# Patient Record
Sex: Male | Born: 1990 | Race: White | Hispanic: No | Marital: Single | Smoking: Current every day smoker
Health system: Southern US, Community
[De-identification: ages and names within clinical notes are randomized; demographics above are authoritative.]

---
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
## 2010-08-28 NOTE — Op Note (Signed)
NAME:  Spencer Barr, SKODA NO.:  [id number]   MEDICAL RECORD NO.:  [id number]          PATIENT TYPE:  INP   LOCATION:  2550                         FACILITY:  [facility]   PHYSICIAN:  Kerrin Champagne, M.D.   DATE OF BIRTH:  11-06-1990   DATE OF PROCEDURE:  09/21/2005  DATE OF DISCHARGE:                                 OPERATIVE REPORT   PREOPERATIVE DIAGNOSIS:  Left open grade 2 tibia-fibula fracture, occurred  June 03, 2005.  He underwent initial irrigation and debridement,  returned for a second irrigation and debridement, with closure over a drain  and application of external fixator.  Fracture site is comminute, with  butterfly fragment of the tibia.  The fibula has gone on to heal.  He  returned to the operating room for application of a medial external fixator  frame with transverse loading rods between both medial and lateral frames.  He has since gone on to form callus at the fracture site, and it is time to  remove his external fracture.   POSTOPERATIVE DIAGNOSIS:  Left open grade 2 tibia-fibula fracture, occurred  June 03, 2005.  He underwent initial irrigation and debridement,  returned for a second irrigation and debridement, with closure over a drain  and application of external fixator.  Fracture site is comminute, with  butterfly fragment of the tibia.  The fibula has gone on to heal.  He  returned to the operating room for application of a medial external fixator  frame with transverse loading rods between both medial and lateral frames.  He has since gone on to form callus at the fracture site, and it is time to  remove his external fracture.   PROCEDURES:  1.  Removal of external fixator left tibia.  2.  Evaluation of fracture under anesthesia.  3.  Incision, drainage, and debridement of six Schanz screw pin tracts left      tibia, four lateral and two medial.  4.  Application of a short-leg fiberglass cast.   SURGEON:  Kerrin Champagne,  M.D.   ASSISTANT:  Wende Neighbors, P.A-.C.   ANESTHESIA:  General via orotracheal.  Operator:  Judie Petit, M.D.   SPECIMENS:  Cultures of the lower two pin tracts on the lateral side of the  tibia were sent for culture and sensitivity in both anaerobic and aerobic.   ESTIMATED BLOOD LOSS:  20 cc.   COMPLICATIONS:  None.   The patient returned to the PACU in good condition.   HISTORY OF PRESENT ILLNESS:  The patient is a 20 year old male who sustained  a left open grade 2 tibia-fibula fracture when he jumped from a shed to a  trampoline.  Injury occurred June 03, 2005.  He was taken to the  operating room and underwent irrigation, debridement, application of  posterior long-leg splint.  He was returned to the operating room three days  later and underwent repeat irrigation and debridement, with application of  external fixator and closure of the open wound over the medial aspect of the  left distal tibia.  This was done over drains.  He went on to heal the open  wound, all except an area of eschar.  With the fixator in place at about 4-6  weeks postop, noted to be going into angular deformity of varus.  He was  returned to the operating room some 6-1/2 weeks ago and underwent an  irrigation and debridement of the eschar that was found to extend down to  the tibia fracture site.  This was closed with a single suture by Dr.  Myrene Galas.  He had application of a medial tibial frame and manipulation  of the fracture into a valgus and neutral position.  Cross-links were  performed between the medial and lateral frames for a triangular type of  stabilization of the fracture site.  The patient was then allowed to  weightbear and has been weightbearing over the last 4 weeks, 25%, then 50%  weightbearing on his left lower extremity.  Radiographs at last take  demonstrated callus forming at the fracture site both posteriorly and  medially, bridging the butterfly fragment as  well as the distal and proximal  fracture fragments.  With this completed, then the patient was scheduled  then to have removal of this external fixator.  He was also noted to have  erythema and drainage from the distal and lateral pin tracts greater than  the medial pin tracts or proximal pin tracts.   INTRAOPERATIVE FINDINGS:  The patient was found to have some very minimal  remaining lucency over the medial portion of the tibial fracture site, good  callus both proximal and distal, connecting the butterfly fragment to both  the distal fragment and the proximal fracture fragment.  Enough stability  felt to be present that the external fixator could be removed.  Careful  observation, though, will be necessary to ensure that he does not begin to  go into a varus deformity following the removal of the external fixator.   This completed, then the patient had removal of the pins following the  removal of the external fixator frame, and then underwent I&D of the pin  tracts and application of a short-leg cast.   DESCRIPTION OF PROCEDURE:  After adequate general anesthesia, left lower  extremity had removal of the external fixator frame, and then C-arm  fluoroscopy was brought into the field.  Under C-arm fluoroscopy, the  fracture was stressed.  It showed minimal if any movement at the fracture  site medially with stressing of varus and valgus.  With this, it was felt  that the pins could be removed.  Each of the six Schanz screws were then  removed without difficulty.  The patient then underwent prep of his left  lower extremity from the toes to the lower thigh with both Betadine scrub  and prep solution, and was draped in the usual manner.  Each of the pin  tracks then were carefully curettaged and debrided using curettes, a  separate curette for each of the pin tract sites, to prevent cross-  contamination.  While performing the I&D of the lower two pin tracts on the lateral side of the  leg, cultures were sent, both anaerobic and aerobic for  culture and sensitivity purposes.  Following the debridement of each of the  pin tract sites, then, these then were followed for hemostasis.  They showed  excellent hemostasis, and Xeroform was then placed over each of the pin  tract areas, as well as over a small eschar that had previously been I&Ded.  It showed no erythema,  drainage, or fluctuans.  A well-padded short-leg cast  was then applied, with 4 x 4s and ABDs over the anterior aspect of the leg,  over both the pins medially and laterally.  Sterile Webril was applied, and  then regular Webril and a synthetic short-leg cast applied, with the leg in  slight valgus to neutral alignment.  The patient was then reactivated,  extubated, and returned to the recovery room in satisfactory condition.  All  instrument and sponge counts were correct.  Pins will be given to the  patient to take home.      Kerrin Champagne, M.D.  Electronically Signed     JEN/MEDQ  D:  09/21/2005  T:  09/21/2005  Job:  [id number]

## 2010-08-28 NOTE — Discharge Summary (Signed)
NAME:  Spencer Barr, Spencer Barr NO.:  [id number]   MEDICAL RECORD NO.:  [id number]          PATIENT TYPE:  INP   LOCATION:  6125                         FACILITY:  [facility]   PHYSICIAN:  Kerrin Champagne, M.D.   DATE OF BIRTH:  08/27/1990   DATE OF ADMISSION:  06/02/2005  DATE OF DISCHARGE:  06/07/2005                                 DISCHARGE SUMMARY   DISCHARGE DIAGNOSIS:  An open grade II left tibia-fibula fracture transverse  with medial butterfly fragment involving the mid distal one-third shaft.   PROCEDURES PERFORMED DURING THIS HOSPITALIZATION:  1. Incision, irrigation and debridement of left open grade II tibia-fibula      fracture involving the distal middle one-third of the tibia shaft.      Application of a posterior splint performed on June 01, 2005.  2. Incision, irrigation and debridement of left open grade II tibia-fibula      fracture with application of a Synthes external fixator unilateral      frame over the lateral aspect of the left distal tibia/fibula performed      on June 04, 2005.   CHIEF COMPLAINT:  Was left leg pain, deformity, open wound.   HISTORY OF PRESENT ILLNESS:  [age]-year-old male who jumped from a shed  to a trampoline and landed sustaining a left open mid shaft, distal middle  one-third tibia/fibula fracture with medial open wound.  He was wearing  pants at the time of this landing.  Seen in the emergency room, evaluated.   PAST MEDICAL AND SURGICAL HISTORY:  Unremarkable.   ALLERGIES:  NONE.   CLINICAL EXAM:  Normal-appearing young male in mild distress.  He was with  normal chest.  Normal core.  His abdominal exam was negative.  His injuries  appeared limited to his left lower extremity.  Pulses were intact and  normal.  Motor and sensory were noted.  Noted to have an open wound, 5 cm  transverse at the mid distal one-third tibia medially.  The patient was seen  and evaluated in the emergency room.  His preoperatively  laboratory data  indicated a hemoglobin of 13.9, hematocrit 39.4%, white cell count was 12.5.  Platelet count 246.  His admission electrolytes were normal.  His glucose  121 with IV with D5-1/2.  BUN was 17, creatinine 1.0, calcium 8.4.  Urinalysis was unremarkable.   HOSPITAL COURSE:  Spencer Barr, a 20 year old male, in good health prior  to injury he sustained on June 01, 2005.  He was taken to the operating  room on June 01, 2005 for an open left distal middle one-third tibia-  fibular fracture.  He underwent a debridement of the open wound over the  medial aspect of distal tibia with irrigation.  Found to have a butterfly  fragment medially, a fracture of his tibia transverse otherwise.  Fairly  unstable.  The area of open wound dictated that it would be difficult to  apply a plate, screws and gain proper closure.  Because of the patient's  age, open growth physis, an intramedullary nail was not felt to be possible.  Patient  was placed into a posterior splint postoperatively and then  following this was returned to the operating room 3 days later to undergo a  second irrigation and debridement with primary closure of the wound over the  medial aspect of his tibia.  This was done without difficulty with closure  over drains.  An external fixed screw was applied over the lateral aspect of  the leg in order to allow for wound care medially and the possible need for  a plastic surgery consult and possible coverage.  Patient, however, did well  and did appear to go on and heal the incision over the medial aspect of the  tibia.  Several small wounds that represented small open wounds at the  fracture sites were debrided at the time of surgery and drains have been  placed through these areas.  On postoperative day number 1 the drains were  discontinued from the left leg.  Patient had dressing changes performed.  He  was placed on initial antibiotics of Ancef, remained on Ancef  until cultures  returned indicating no growth at 4 days following intraoperative cultures.  The patient was placed on Lovenox for anti-DVT prophylaxis.  On  postoperative day 3 from his second irrigation debridement, patient was  alert, awake and oriented.  He was afebrile.  Vital signs were stable.  He  was showing minimal bloody drainage from the incision site.  Pin tracts were  clear over the external fixator.  Patient was ambulating, using crutches  independently.  He was discharged home on June 07, 2005, placed on  Keflex 500 mg p.o. q.8 hours for a period of 7 days, enteric-coated aspirin  to be used for anti-DVT prophylaxis 325 mg one tablet a day and Vicodin for  discomfort.  Taught how to do pin tract care and to keep the area of wound  dry.  He should obey strict nonweightbearing of the left lower extremity and  elevate the leg while at rest to decrease attendances or swelling.   DISPOSITION:  Spencer Barr was discharged home on June 03, 2005 on  medications of above-stated, Vicodin one to two q.4 to 6 hours p.r.n. pain  maximum 8 a day, Keflex 500 mg p.o. t.i.d. x7 days, enteric-coated aspirin  325 mg one tablet daily.  He is to keep the wound and external fixateur dry.  Be seen back in the office for followup in regards to his leg in a period of  2 to 3 days from the time of his discharge.  Status at the time of discharge  was improved.      Kerrin Champagne, M.D.  Electronically Signed     JEN/MEDQ  D:  11/18/2005  T:  11/18/2005  Job:  [id number]

## 2010-08-28 NOTE — Op Note (Signed)
NAME:  GRAYSYN, BACHE NO.:  [id number]   MEDICAL RECORD NO.:  [id number]          PATIENT TYPE:  INP   LOCATION:  6148                         FACILITY:  [facility]   PHYSICIAN:  Kerrin Champagne, M.D.   DATE OF BIRTH:  1990/08/08   DATE OF PROCEDURE:  DATE OF DISCHARGE:                                 OPERATIVE REPORT   DATE OF PROCEDURE:  June 01, 2005.   SURGEON:  Kerrin Champagne, M.D.   ASSISTANT:  None.   ANESTHESIOLOGIST:  Kaylyn Layer. Michelle Piper, M.D.   ANESTHESIA:  General via orotracheal intubation.   PREOPERATIVE DIAGNOSES:  Left open grade 2 tibiofibular fracture comminuted  transverse middle one third fracture type, open wound over the medial one  third of the leg.   POSTOPERATIVE DIAGNOSES:  Left open grade 2 tibiofibular fracture comminuted  transverse middle one third fracture type, open wound over the medial one  third of the leg.   OPERATION:  Incision, irrigation and debridement (excisional) of left open  grade 2 tibiofibular fracture, placement of a long leg posterior splint,  closure of wound over drains leaving the central portion of wound open.   SPECIMENS:  Deep wound cultures obtained, anaerobic and aerobic from the  tibial fracture site.   ESTIMATED BLOOD LOSS:  100 cubic centimeters.   COMPLICATIONS:  None.   COMPARTMENTS MEASURED:  Anterior compartment left leg 32, lateral  compartment 35, posterior deep compartment 55, superficial posterior 48.   DISPOSITION:  Patient returned to the PACU in good condition.   HISTORY:  Patient is a 20 year old man who jumped from a shed to a  trampoline today sustaining a left open middle 1/3 tibial-fibular fracture  with comminution in transverse pattern with open wound over the anteromedial  aspect left tibia about 5 cm and transverse with 2 small puncture wounds  into and out below this transverse wound medial and distal. Patient was seen  in the emergency room. He has good neurovascular status  with all  compartments soft and no severe pain, numbness or paleness of his leg. He  was brought to the operating room to undergo an irrigation, debridement,  incision and drainage of this open grade 2 tibiofibular fracture with  placement into the posterior splint as he has premature or open growth  plates.   INTRAOPERATIVE FINDINGS:  Basically comminuted transverse tibial fracture  middle one third shaft. The incision was able to be closed loosely. The  central portion was left open and packed with a single 4 inch sponge saline  wet to dry. Two one fourth inch Penrose drains used to drain the deep  portion of the wound both proximal and distal.   DESCRIPTION OF PROCEDURE:  After adequate general anesthesia, left lower  extremity was prepped with Betadine scrub and prep solution. The tourniquet  was about the upper thigh and was not used. He was draped in the usual  manner. The initial procedure was performed using loupe magnification and  head lamp. Patient underwent incision and excision of the borders of the  open wound over the medial aspect of the left  tibia as well as soft tissue  with subcu layers that was devitalized all the way down to the tibial bone.  An incision was then made carrying the laceration anterior and distal and  the posterior aspect of the wound slightly posterior and proximal. This made  an S-shaped type incision and allowed for exposure of the fracture site. The  fracture was cultured using anaerobic and aerobic culture tubes. He then  underwent opening of the fracture site and irrigation using curets to  curettage the fracture site while irrigating with copious amounts of  irrigant solution. A total of 3 liters of saline solution was passed through  the wound and then with double antibiotic solution. Once this was completed  then, the fracture was reduced. The skin edges were then carefully  approximated with interrupted #2-0 Prolene sutures using flap  stitches to  the medial aspect of the wound and vertical mattress sutures laterally. The  patient's saphenous vein appeared intact.   A 1/4 inch Penrose drain was placed through a separate stab incision  proximally down to the fracture site and extending posteriorly. Another 1/4  inch Penrose drain was placed through an area of penetrating hole distal to  the transverse laceration area. This was also passed down the fracture site.  The remaining central portion of the wound was then packed with a 4 inch  sponge wet to dry. Adaptic was applied with 4 x 4's and ABD pad wrapped the  entire length and then with sterile Webril and then a well-padded posterior  long leg splint applied extending from the mid portion of the foot  proximally to the upper thigh with the knee in slight flexion at about 20  degrees with 2 side struts at the ankle of 3 x 15. This provided good  stabilization of the fracture. Patient was then reactivated, extubated and  returned to the recovery room in satisfactory condition. Note that prior to  application of the splint, the patient underwent the measurement of his  compartments. His blood pressure at the time was 111/41. Compartments  measuring anteriorly on the left side were 32 and laterally 35 and deep  posterior 55 and superficial posterior 48. It was felt that this in  relationship to patient's systolic blood pressure was not significant to  warrant fasciotomies. Patient also did not describe preoperatively any  significant numbness or severe pain. Intraoperatively, he appeared to have  supple compartments even though the compartments appeared to be slightly  elevated posteriorly. He was then reactivated, extubated and returned to the  recovery room in satisfactory condition.      Kerrin Champagne, M.D.  Electronically Signed     JEN/MEDQ  D:  06/01/2005  T:  06/02/2005  Job:  [id number]

## 2010-08-28 NOTE — Op Note (Signed)
NAME:  Spencer Barr, PAYETTE NO.:  [id number]   MEDICAL RECORD NO.:  [id number]          PATIENT TYPE:  OIB   LOCATION:  6122                         FACILITY:  [facility]   PHYSICIAN:  Kerrin Champagne, M.D.   DATE OF BIRTH:  1990-06-09   DATE OF PROCEDURE:  08/10/2005  DATE OF DISCHARGE:                                 OPERATIVE REPORT   PREOPERATIVE DIAGNOSIS:  Left open grade 2 distal middle one-third tibia -  fibula fracture with a butterfly fragment.  The patient nearly 8 weeks  following irrigation debridement and primary closure with application of  external fixator.  The patient is showing gradual drift into varus angular  deformity of the fracture site.   POSTOPERATIVE DIAGNOSIS:  Left open grade 2 distal middle one-third tibia -  fibula fracture with a butterfly fragment.  The patient nearly 8 weeks  following irrigation debridement and primary closure with application of  external fixator.  The patient is showing gradual drift into varus angular  deformity of the fracture site.  The patient also at the time of surgery was  found to have two sinus tracts extending from skin down to the open fracture  wound site and down to bone.  The drainage obtained from the wound site was  primarily clear and not purulent, was sent for Gram stain.   PROCEDURE:  1.  Exploration of two sinus tracts over the left anterior medial distal one      third tibia extending down to bone with the curettage, incision and      excisional debridement of wound.  2.  Application of the medial frame to the left tibia using an external      fixator, two medial pins and a carbon fiber rod.  Interconnecting the      medial frame to the present lateral frame and performing a closed      manipulation of the fracture site to reestablish anatomic alignment.      Closure of wound over the medial aspect of left tibia with a single      nylon suture.   SURGEON:  Dr. Vira Browns.   ASSISTANT:  Doralee Albino. Carola Frost, M.D.   ANESTHESIA:  GOT Dr. Krista Blue.   ESTIMATED BLOOD LOSS:  25 mL.   DRAINS:  None.   BRIEF CLINICAL HISTORY:  This patient is a 20 year old male who jumped from  a shed to a trampoline February 21 sustaining a left open grade 2 tibia -  fibula fracture.  The fracture was comminuted with the butterfly fragment of  the tibia laterally at the fracture site.  He was taken to the operating  room, underwent irrigation and debridement was placed into a splint,  returned to the operating room and underwent repeat irrigation and  debridement, closure over drains with application of external fixator  anterolaterally.  The unilateral frame construct was used.  The patient did  well postoperatively went on to heal the open fracture site wounds with two  little small punctate wounds remaining below the open fracture site.   The patient had a follow-up radiographs  which demonstrated progressive  increasing varus deformity of the fracture site.  The fibula fracture  appearing to go on to heal allowing the fracture to drift into varus about 5  degrees.  A second opinion was obtained with Dr. Daneil Dolin for evaluation  of the fracture and the recommendations regarding further care treatment.  Dr. Carola Frost has recommended the application of the medial frame and  manipulation of the fracture into a more anatomic position and alignment.  The initial procedure was performed by Dr. Myrene Galas.  He observed the  wounds that were punctate wounds below the initial laceration and that was  closed  some 8 weeks ago.  These appeared to dive down to the fracture site  and he did open area of draining material.  Culture and sensitivity and Gram  stains were obtained regards this material.  The patient is on Keflex  intraoperatively and preoperatively.  The patient was brought to the  operating room to undergo the aforementioned procedure.   DESCRIPTION OF PROCEDURE:  After adequate general  anesthesia, left lower  extremity was prepped Betadine scrub prep solution and Betadine spray over  the entire external fixator.  Draped in the usual manner, tourniquet about  the left upper thigh that was not used during the procedure.  Available in  case there was any need for its use.  The patient underwent exploration of  the wound along the anterior medial aspect of left mid distal third tibia  and two small punctate areas below the transverse laceration area were  observed to track deep and in their exploration dropped into the fracture  site with probing.  The more anterior of the two punctate wounds about 2-3  mm in width was opened and excised.  The opening down to the fracture site  culture and sensitivity and Gram stain was obtained.  The posterior of the  two appeared to be a punctate wound and did not require any further  debridement.  Irrigation was performed of both wound sites debridement  carried out over the bony edges through the anterior open wound.  Soft  tissues were allowed to fall back into place following this awaiting the  Gram stain. The Gram stain report indicated few white cells and no organisms  seen.  Therefore a single suture was placed in the anterior wound to close  this wound allow for soft tissue to heal over the fracture site, protecting  the deeper structures.  Following this then a stab incision was made  proximally at a point about 90 degrees medial to the pins and the proximal  tibia fracture site.  The stab incision was made directly in the mid area  between the two previous pins and a drill then used to make an directional  drill through the superficial cortex and deep cortex.  A 5.5 external  fixator pin was then passed through the drill hole drilled, engaging the  deep cortex.  A second pin was placed parallel to that of the first but in  angle 90 degrees from the distal two tibial fracture pins and again 90 degrees to these first a stab incision  made spreading the subcu layers down  to bone and then using a 3.5 drill bit and then using a drill pin for the  external fixator distally.  This completed application of the two pins  distal.  Fasteners were then applied and a carbon fiber rod passed  connecting the two medial tendons.  Next C-arm fluoro  was brought into the  field.  Note that C-arm fluoro was used to ascertain the placement of the  pins both proximal and distal and the relationship to the two previous pins  distal to the previous pins proximal.  With the x-ray brought onto the field  then a mallet was placed against the lateral aspect of the distal one-third  tib-fib site and applied lateral pressure from lateral to medial at the  fracture site.  Pressure was then applied to the foot and ankle region and  proximal tibia causing the fracture to be manipulated into more valgus  alignment and after obtaining the valgus near neutral alignment with slight  valgus the medial fasteners to the medial frame were then tightened down to  the fiber carbon rod fixing the degree of varus and valgus to slight valgus  alignment.  The lateral fixator fasteners were all loosened to allow for the  fracture to be able to be manipulated into valgus.  Once the correct degree  of valgus was present and each of the fasteners with a lateral drain were  carefully tightened stabilizing the fracture in its present position and  alignment.  Transverse loading rods were then placed distally and proximally  as far away from the fracture site as possible making a stable box  configuration with two pins in the distal fragment laterally and one  medially and two pins in the proximal fragment laterally and one medially.  This appeared to stabilize fracture quite nicely in a slight degree of  valgus.  Intraoperative long cassette views were obtained demonstrating the  fracture in excellent position alignment both AP and lateral planes.  This  completed then  Xeroform was placed about each of the wounds over the  anterior medial tibia, the pin skin intervals were opened over the distal  pins laterally.  The left side as well as proximally.  Xeroform placed here,  4x4s wrapped about the pins well as the base of the new pins placed  medially.  4x4s, ABD pad placed over the open wound site over the anterior  medial tibia and this held in place with loose Kerlix.  The patient was then  reactivated, extubated, returned to recovery room in satisfactory condition.  All instrument sponge counts were correct.  Post procedure a discussion with  the patient's father indicating to him that we had found this two open  wounds extending down to open fracture site and that the presence of  infection is still not clear.  Will obtain tests determine if cultures show  any sign of infection.  The patient will be admitted for 24-hour observation  while studies were being done.  This will also be done for pain control as he had curettage performed of the fracture site.      Kerrin Champagne, M.D.  Electronically Signed     JEN/MEDQ  D:  08/10/2005  T:  08/11/2005  Job:  [id number]

## 2014-11-19 ENCOUNTER — Emergency Department (HOSPITAL_BASED_OUTPATIENT_CLINIC_OR_DEPARTMENT_OTHER)
Admission: EM | Admit: 2014-11-19 | Discharge: 2014-11-19 | Disposition: A | Discharge status: Home / Self Care | Payer: Managed Care, Other (non HMO) | Attending: Physician Assistant | Admitting: Physician Assistant

## 2014-11-19 ENCOUNTER — Encounter (HOSPITAL_BASED_OUTPATIENT_CLINIC_OR_DEPARTMENT_OTHER): Payer: Self-pay

## 2014-11-19 ENCOUNTER — Emergency Department (HOSPITAL_BASED_OUTPATIENT_CLINIC_OR_DEPARTMENT_OTHER): Payer: Managed Care, Other (non HMO)

## 2014-11-19 DIAGNOSIS — M10021 Idiopathic gout, right elbow: Secondary | ICD-10-CM | POA: Diagnosis not present

## 2014-11-19 DIAGNOSIS — M25521 Pain in right elbow: Secondary | ICD-10-CM | POA: Diagnosis present

## 2014-11-19 DIAGNOSIS — R110 Nausea: Secondary | ICD-10-CM | POA: Insufficient documentation

## 2014-11-19 DIAGNOSIS — L03113 Cellulitis of right upper limb: Secondary | ICD-10-CM | POA: Diagnosis not present

## 2014-11-19 DIAGNOSIS — R609 Edema, unspecified: Secondary | ICD-10-CM

## 2014-11-19 MED ORDER — CEPHALEXIN 250 MG PO CAPS
500.0000 mg | ORAL_CAPSULE | Freq: Once | ORAL | Status: AC
  Administered 2014-11-19: 500 mg via ORAL
  Filled 2014-11-19: qty 2

## 2014-11-19 MED ORDER — CEPHALEXIN 500 MG PO CAPS: 500.0000 mg | ORAL_CAPSULE | Freq: Two times a day (BID) | ORAL | Status: AC

## 2014-11-19 NOTE — ED Notes (Signed)
Pt report tenderness to right elbow, sts "its poofy." Denies injury.

## 2014-11-19 NOTE — Discharge Instructions (Signed)

## 2014-11-19 NOTE — ED Provider Notes (Signed)
CSN: [id number]     Arrival date & time 11/19/14  1019 History   First MD Initiated Contact with Patient 11/19/14 1022     Chief Complaint  Patient presents with  . 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 Patient has right elbow swelling. Patient ate and drank richly this weekend. He has swelling to his right elbow.patient's father had cellulitis and was concerned so told patient to come here to the emergency department. No fevers no systemic symptoms. She has been feeling a little shaky and a little nauseated since coming back from his trip  History reviewed. No pertinent past medical history. History reviewed. No pertinent past surgical history. No family history on file. History  Substance Use Topics  . Smoking status: Never Smoker   . Smokeless tobacco: Not on file  . Alcohol Use: No    Review of Systems  Constitutional: Negative for fever and activity change.  HENT: Negative for drooling and hearing loss.   Eyes: Negative for discharge and redness.  Respiratory: Negative for cough and shortness of breath.   Cardiovascular: Negative for chest pain.  Gastrointestinal: Positive for nausea. Negative for abdominal pain.  Genitourinary: Negative for dysuria and urgency.  Musculoskeletal: Positive for joint swelling. Negative for arthralgias.  Allergic/Immunologic: Negative for immunocompromised state.  Neurological: Negative for seizures and speech difficulty.  Psychiatric/Behavioral: Negative for behavioral problems and agitation.  All other systems reviewed and are negative.     Allergies  Review of patient's allergies indicates no known allergies.  Home Medications   Prior to Admission medications   Not on File   BP 162/82 mmHg  Pulse 80  Temp(Src) 98.3 F (36.8 C) (Oral)  Resp 14  Ht   (1.753 m)  Wt 149 lb (67.586 kg)  BMI 21.99 kg/m2  SpO2 98% Physical Exam  Constitutional: He is oriented to person, place, and time. He appears well-nourished.  HENT:  Head: Normocephalic.  Mouth/Throat: Oropharynx is clear and moist.  Eyes: Conjunctivae are normal.  Neck: No tracheal deviation present.  Cardiovascular: Normal rate.   Pulmonary/Chest: Effort normal. No stridor. No respiratory distress.  Musculoskeletal: Normal range of motion. He exhibits no edema.  Right elbow with small amount of swelling and erythema to tip of elbow. No surrounding erythema. No abscess.  Neurological: He is oriented to person, place, and time. No cranial nerve deficit.  Skin: Skin is warm and dry. Rash noted. He is not diaphoretic.  Psychiatric: He has a normal mood and affect. His behavior is normal.  Nursing note and vitals reviewed.   ED Course  Procedures (including critical care time) Labs Review Labs Reviewed - No data to display  Imaging Review No results found.   EKG Interpretation None      MDM   Final diagnoses:  Swelling   History this 24 year old male stenting with right elbow swelling. 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 Mild warmth to it. We'll treat for overlying cellulitis. However more likely this is gout. Patient's father has gout. Patient is young for this however has been having an indulgent lifestyle. Patient has no pain with movement of elbow. Therefore doubt any intra-articular infection.  We'll have patient follow up with primary care provider    Morghan Kester Randall An, MD 11/19/14 1534

## 2015-03-12 ENCOUNTER — Ambulatory Visit (INDEPENDENT_AMBULATORY_CARE_PROVIDER_SITE_OTHER): Payer: Worker's Compensation | Admitting: Emergency Medicine

## 2015-03-12 VITALS — BP 140/78 | HR 70 | Temp 98.6°F | Resp 16 | Ht 68.75 in | Wt 161.8 lb

## 2015-03-12 DIAGNOSIS — S61011A Laceration without foreign body of right thumb without damage to nail, initial encounter: Secondary | ICD-10-CM

## 2015-03-12 DIAGNOSIS — Z23 Encounter for immunization: Secondary | ICD-10-CM

## 2015-03-12 MED ORDER — CEPHALEXIN 500 MG PO CAPS: 500.0000 mg | ORAL_CAPSULE | Freq: Two times a day (BID) | ORAL | Status: AC

## 2015-03-12 NOTE — Progress Notes (Signed)
[facility] [address], [place] [address] [phone]  Date:  03/12/2015   Name:  Spencer PrestoRyan J Mudry   DOB:  October 27, 1990   MRN:  [id number]  PCP:  No primary care provider on file.    Chief Complaint: Laceration   History of Present Illness:  This is a 24 y.o. male who is presenting with a laceration to his right thumb that occurred at work. 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 The laceration is at tip of thumb just distal to nail. He cleaned wound at work and held pressure with gauze to stop the bleeding. He denies significant pain, paresthesias, difficulty with movement. He thinks he got a tdap 4 years ago but isn't sure.  Review of Systems:  Review of Systems See HPI  Medications reviewed.   No Known Allergies  Medication list has been reviewed and updated.  Physical Examination:  Physical Exam  Constitutional: He is oriented to person, place, and time. He appears well-developed and well-nourished. No distress.  HENT:  Head: Normocephalic and atraumatic.  Right Ear: Hearing normal.  Left Ear: Hearing normal.  Nose: Nose normal.  Eyes: Conjunctivae and lids are normal. Right eye exhibits no discharge. Left eye exhibits no discharge. No scleral icterus.  Pulmonary/Chest: Effort normal. No respiratory distress.  Musculoskeletal: Normal range of motion.  Neurological: He is alert and oriented to person, place, and time.  Skin: Skin is warm and dry.  Right thumb with 2 cm laceration just distal to nail, skin separated from nail.  Wound very dirty. Cap refill intact. FROM. Sensation intact.  Psychiatric: He has a normal mood and affect. His speech is normal and behavior is normal. Thought content normal.   BP 140/78 mmHg  Pulse 70  Temp(Src) 98.6 F (37 C) (Oral)  Resp 16  Ht 5' 8.75" (1.746 m)  Wt 161 lb 12.8 oz (73.392 kg)  BMI 24.07 kg/m2  SpO2 99%  Procedure: Verbal consent obtained.  Skin was anesthetized with 5 cc 1% lido without epi  by digital block and cleaned with soap and water. Adherent debris in wound removed with curette. Laceration was sutured with #1 3.0 ethilon suture through the nail and #1 5.0 prolene suture through skin. Wound was dressed and wound care discussed.   Assessment and Plan:  1. Thumb laceration, right, initial encounter As much debris as possible removed with soap and water and curette. Wound repaired. Placed on keflex bid x 10 days to prevent infection d/t dirty wound. Return in 7-10 days for suture removal. Wound care discussed. Keep wound covered, clean and dry at work.  2. Need for Tdap vaccination - Tdap vaccine greater than or equal to 7yo IM   Roswell MinersNicole V. Dyke BrackettBush, PA-C, MHS [facility]  03/12/2015\

## 2015-03-12 NOTE — Patient Instructions (Signed)
WOUND CARE Please return in 10 days to have your stitches/staples removed or sooner if you have concerns. . Keep area clean and dry with dressing in place for 24 hours. . After 24 hours, remove bandage and wash wound gently with mild soap and warm water. When skin is dry, reapply a new bandage. Once wound is no longer draining, may leave wound open to air. . Continue daily cleansing with soap and water until stitches/staples are removed. . Do not apply any ointments or creams to the wound while stitches/staples are in place, as this may cause delayed healing. . Notify the office if you experience any of the following signs of infection: Swelling, redness, pus drainage, streaking, fever >101.0 F . Notify the office if you experience excessive bleeding that does not stop after 15-20 minutes of constant, firm pressure.   

## 2015-03-12 NOTE — Progress Notes (Signed)
  Medical screening examination/treatment/procedure(s) were performed by non-physician practitioner and as supervising physician I was immediately available for consultation/collaboration.     

## 2017-03-05 IMAGING — DX DG ELBOW COMPLETE 3+V*R*
4 series · 4 of 4 positions shown · non-contrast
Comparison: None.

CLINICAL DATA: Elbow swelling.  No injury

EXAM:
RIGHT ELBOW - COMPLETE 3+ VIEW

[elbow ap (1 of 3)]
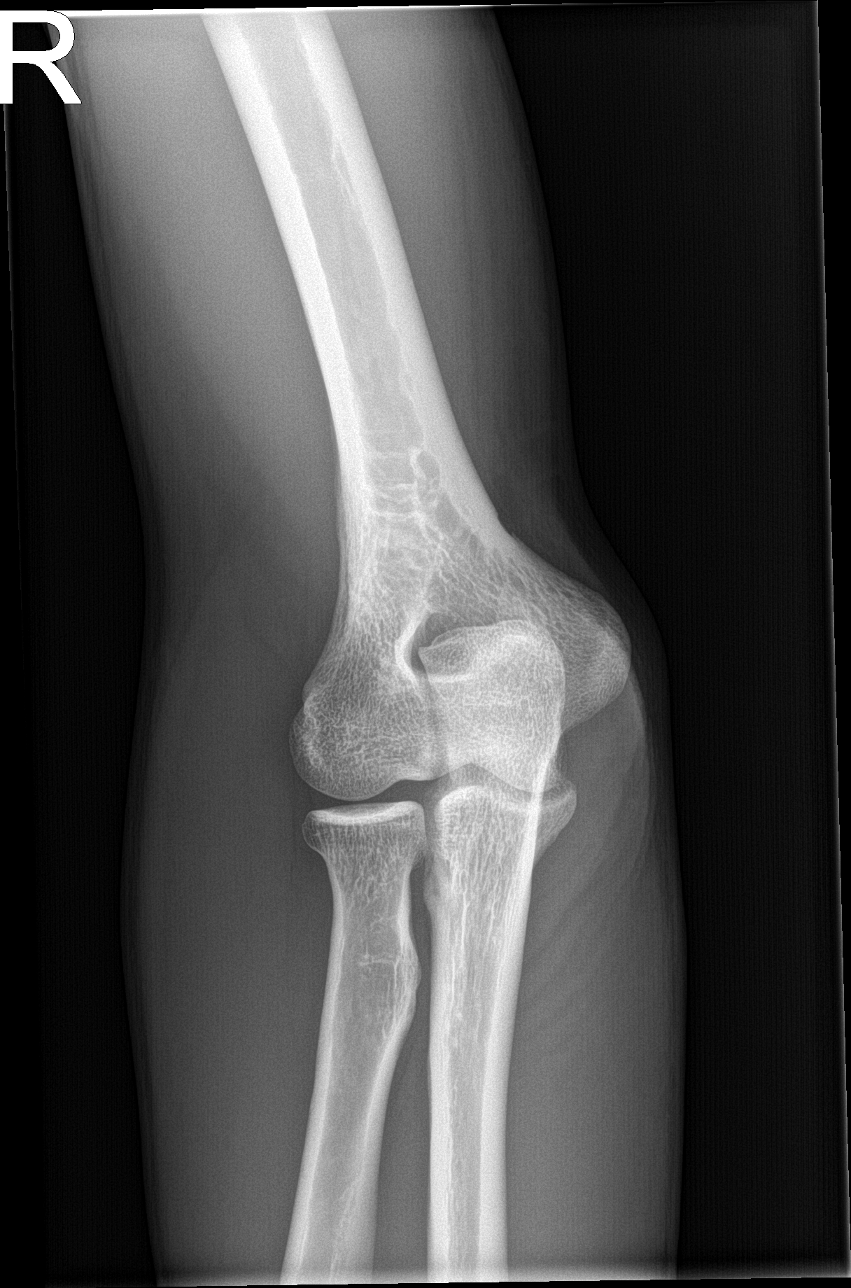

[elbow lat]
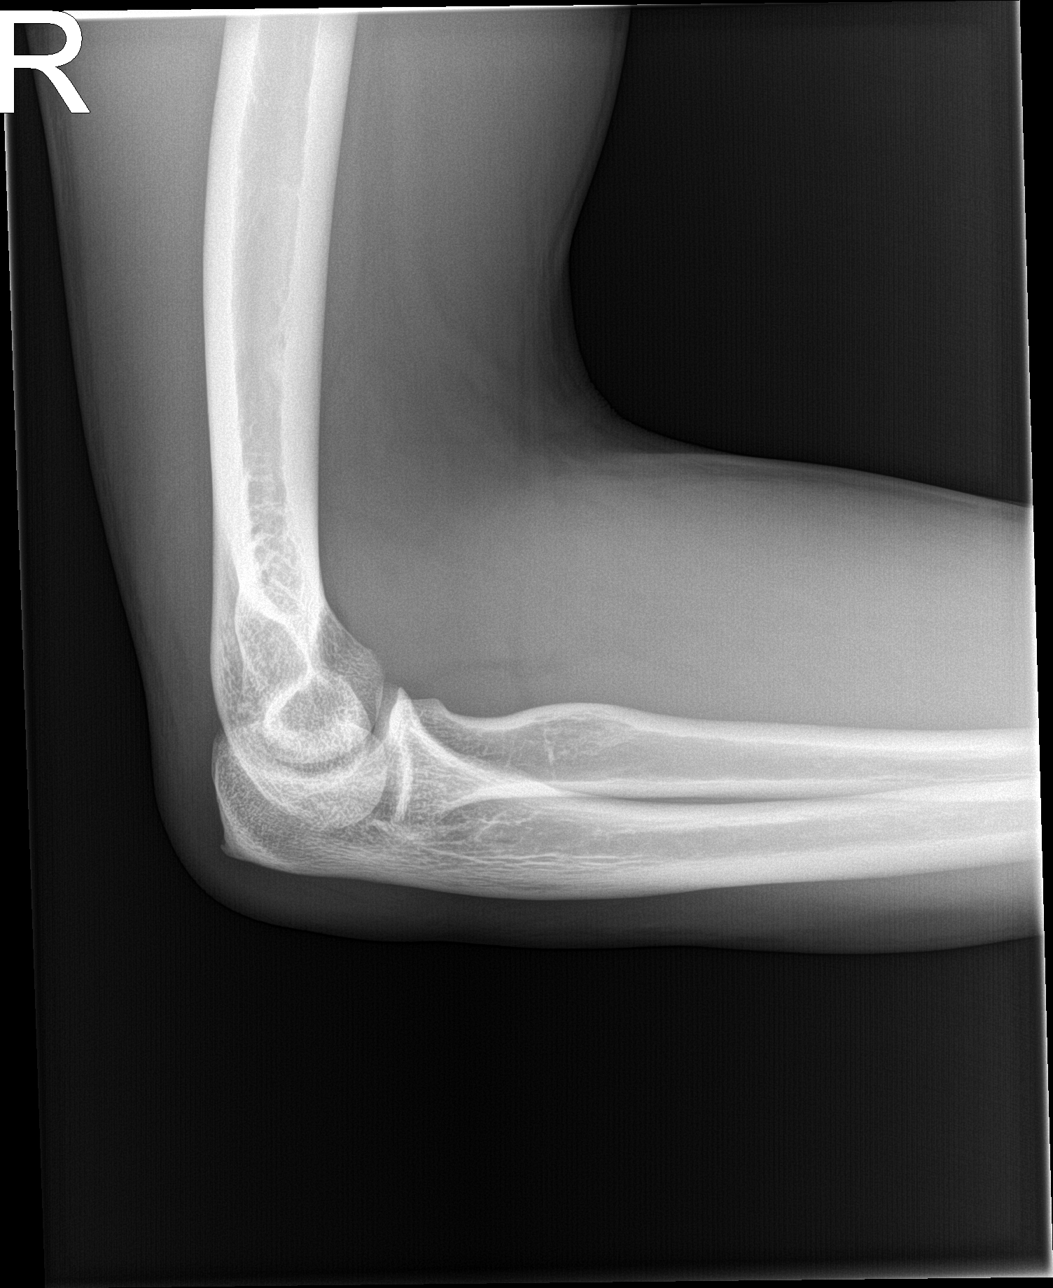

[elbow ap (2 of 3)]
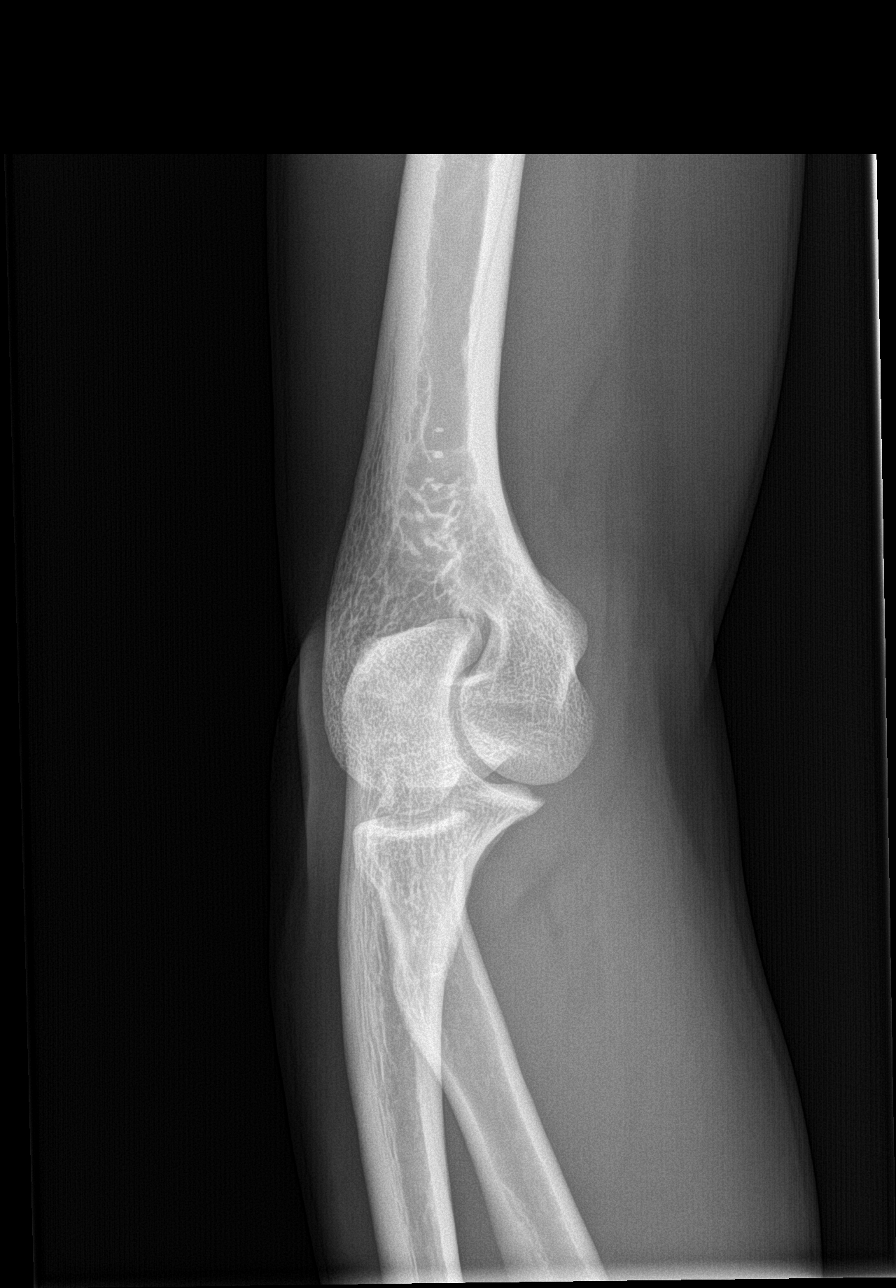

[elbow ap (3 of 3)]
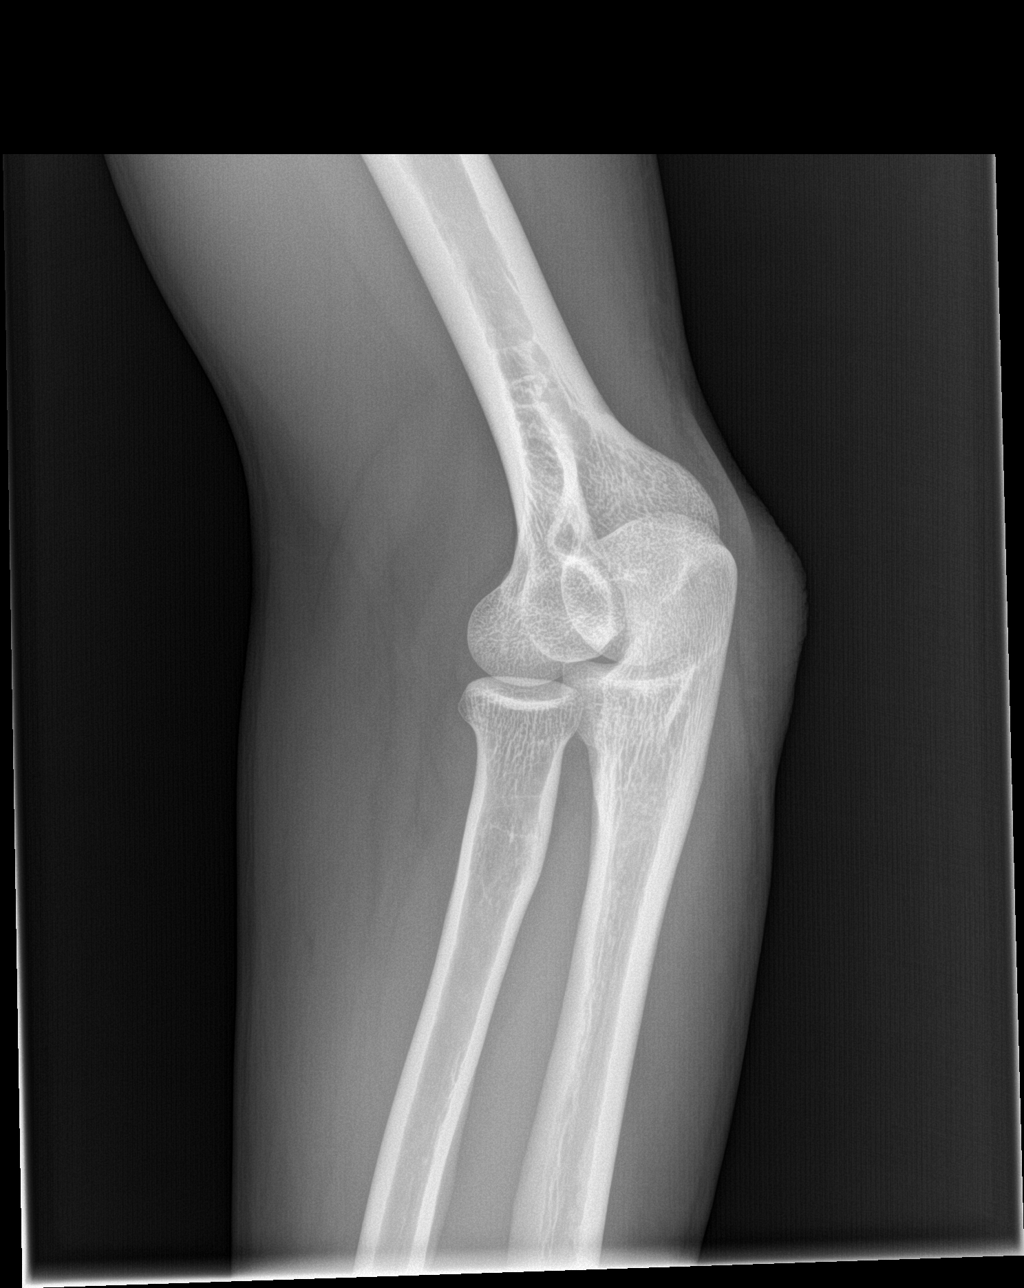

[4 of 4 positions shown; findings below may reference images not displayed]

FINDINGS: There is no evidence of fracture, dislocation, or joint effusion.
There is no evidence of arthropathy or other focal bone abnormality.
Soft tissues are unremarkable.
IMPRESSION: Negative.
